# Patient Record
Sex: Female | Born: 1950 | Race: White | Hispanic: No | Smoking: Never smoker
Health system: Southern US, Community
[De-identification: ages and names within clinical notes are randomized; demographics above are authoritative.]

## PROBLEM LIST (undated history)

## (undated) DIAGNOSIS — K219 Gastro-esophageal reflux disease without esophagitis: Secondary | ICD-10-CM

## (undated) DIAGNOSIS — R928 Other abnormal and inconclusive findings on diagnostic imaging of breast: Secondary | ICD-10-CM

## (undated) DIAGNOSIS — I10 Essential (primary) hypertension: Secondary | ICD-10-CM

## (undated) DIAGNOSIS — R42 Dizziness and giddiness: Secondary | ICD-10-CM

## (undated) DIAGNOSIS — R011 Cardiac murmur, unspecified: Secondary | ICD-10-CM

## (undated) HISTORY — PX: COLONOSCOPY: SHX174

## (undated) HISTORY — DX: Other abnormal and inconclusive findings on diagnostic imaging of breast: R92.8

## (undated) HISTORY — DX: Essential (primary) hypertension: I10

## (undated) HISTORY — DX: Gastro-esophageal reflux disease without esophagitis: K21.9

## (undated) HISTORY — DX: Cardiac murmur, unspecified: R01.1

## (undated) HISTORY — DX: Dizziness and giddiness: R42

---

## 1988-01-19 DIAGNOSIS — K219 Gastro-esophageal reflux disease without esophagitis: Secondary | ICD-10-CM

## 1988-01-19 HISTORY — DX: Gastro-esophageal reflux disease without esophagitis: K21.9

## 1999-01-19 DIAGNOSIS — I10 Essential (primary) hypertension: Secondary | ICD-10-CM

## 1999-01-19 HISTORY — DX: Essential (primary) hypertension: I10

## 2006-07-06 ENCOUNTER — Ambulatory Visit: Payer: Self-pay | Admitting: Family Medicine

## 2006-09-21 ENCOUNTER — Ambulatory Visit: Payer: Self-pay | Admitting: Gastroenterology

## 2006-12-09 ENCOUNTER — Ambulatory Visit: Payer: Self-pay | Admitting: Gastroenterology

## 2007-09-19 ENCOUNTER — Ambulatory Visit: Payer: Self-pay | Admitting: Family Medicine

## 2007-09-28 ENCOUNTER — Ambulatory Visit: Payer: Self-pay | Admitting: Family Medicine

## 2008-03-27 ENCOUNTER — Ambulatory Visit: Payer: Self-pay | Admitting: Family Medicine

## 2008-10-01 ENCOUNTER — Ambulatory Visit: Payer: Self-pay | Admitting: Family Medicine

## 2009-04-14 ENCOUNTER — Emergency Department (HOSPITAL_COMMUNITY): Admission: EM | Admit: 2009-04-14 | Discharge: 2009-04-14 | Payer: Self-pay | Admitting: Emergency Medicine

## 2009-10-21 ENCOUNTER — Ambulatory Visit: Payer: Self-pay | Admitting: Family Medicine

## 2010-03-12 ENCOUNTER — Ambulatory Visit: Payer: Self-pay | Admitting: Orthopedic Surgery

## 2010-12-01 ENCOUNTER — Ambulatory Visit: Payer: Self-pay | Admitting: Family Medicine

## 2011-01-19 DIAGNOSIS — R42 Dizziness and giddiness: Secondary | ICD-10-CM

## 2011-01-19 HISTORY — PX: MM BREAST STEREO BX*R*R/S: HXRAD496

## 2011-01-19 HISTORY — DX: Dizziness and giddiness: R42

## 2011-12-02 ENCOUNTER — Ambulatory Visit: Payer: Self-pay | Admitting: Family Medicine

## 2011-12-14 ENCOUNTER — Ambulatory Visit: Payer: Self-pay | Admitting: Family Medicine

## 2012-01-10 ENCOUNTER — Ambulatory Visit: Payer: Self-pay | Admitting: General Surgery

## 2012-01-10 HISTORY — PX: BREAST BIOPSY: SHX20

## 2012-01-11 LAB — PATHOLOGY REPORT

## 2012-02-08 ENCOUNTER — Ambulatory Visit: Payer: Self-pay | Admitting: Neurology

## 2012-02-28 ENCOUNTER — Ambulatory Visit: Payer: Self-pay | Admitting: Neurology

## 2012-07-11 ENCOUNTER — Ambulatory Visit: Payer: Self-pay | Admitting: General Surgery

## 2012-07-12 ENCOUNTER — Encounter: Payer: Self-pay | Admitting: General Surgery

## 2012-07-19 ENCOUNTER — Ambulatory Visit: Payer: Self-pay | Admitting: General Surgery

## 2012-07-25 ENCOUNTER — Encounter: Payer: Self-pay | Admitting: General Surgery

## 2012-07-25 ENCOUNTER — Ambulatory Visit (INDEPENDENT_AMBULATORY_CARE_PROVIDER_SITE_OTHER): Payer: BC Managed Care – PPO | Admitting: General Surgery

## 2012-07-25 VITALS — BP 162/82 | HR 76 | Resp 14 | Ht 66.0 in | Wt 206.0 lb

## 2012-07-25 DIAGNOSIS — N63 Unspecified lump in unspecified breast: Secondary | ICD-10-CM

## 2012-07-25 DIAGNOSIS — R928 Other abnormal and inconclusive findings on diagnostic imaging of breast: Secondary | ICD-10-CM

## 2012-07-25 NOTE — Progress Notes (Signed)
Patient ID: Molly Schwartz, female   DOB: March 01, 1950, 62 y.o.   MRN: [id number]  No chief complaint on file.   HPI Molly Schwartz is a 62 y.o. female here today following up from her mammogram done on 07/11/12 cat 1 . Patient dose perform self breast checks and gets regular mammograms. The patient had undergone a stereotactic biopsy on 01/10/2012 for a right breast mass. This is her six-month followup exam. HPI  Past Medical History  Diagnosis Date  . Hypertension 2001  . Heart murmur   . Abnormal mammogram, unspecified   . Vertigo 2013  . GERD (gastroesophageal reflux disease) 1990    No past surgical history on file.  Family History  Problem Relation Age of Onset  . Tongue cancer Sister 89  . Liver cancer Paternal Uncle   . Liver cancer Maternal Uncle     Social History History  Substance Use Topics  . Smoking status: Never Smoker   . Smokeless tobacco: Never Used  . Alcohol Use: No    Allergies no known allergies  Current Outpatient Prescriptions  Medication Sig Dispense Refill  . amLODipine (NORVASC) 5 MG tablet Take 5 mg by mouth daily.      Marland Kitchen aspirin 81 MG tablet Take 81 mg by mouth daily.      . Iron-Vit C-Vit B12-Folic Acid (IRON 100 PLUS PO) Take by mouth.      . lansoprazole (PREVACID) 30 MG capsule Take 30 mg by mouth daily.       No current facility-administered medications for this visit.    Review of Systems Review of Systems  Constitutional: Negative.   Respiratory: Negative.   Cardiovascular: Negative.     There were no vitals taken for this visit.  Physical Exam Physical Exam  Nursing note reviewed. Constitutional: She is oriented to person, place, and time. She appears well-developed and well-nourished.  Cardiovascular: Normal rate, regular rhythm and normal heart sounds.   Pulmonary/Chest: Breath sounds normal. Right breast exhibits no inverted nipple, no mass, no nipple discharge, no skin change and no tenderness. Left breast exhibits no  inverted nipple, no mass, no nipple discharge, no skin change and no tenderness.  Lymphadenopathy:    She has no cervical adenopathy.    She has no axillary adenopathy.  Neurological: She is alert and oriented to person, place, and time.  Skin: Skin is warm and dry.    Data Reviewed Pathology of 01/10/2012 biopsy showed a simple implant.  07/11/2012 mammogram showed a biopsy clip at the site of the previous deep density in the right breast. BI-RAD-1.    Assessment    Benign breast exam.    Plan    The patient should resume annual screening mammograms in December 2014 under the care of her primary care physician.       Ples Specter 07/25/2012, 9:17 AM

## 2012-07-25 NOTE — Patient Instructions (Addendum)
Patient to return as needed. 

## 2013-01-23 ENCOUNTER — Telehealth: Payer: Self-pay | Admitting: *Deleted

## 2013-01-23 MED ORDER — MELOXICAM 15 MG PO TABS: 15.0000 mg | ORAL_TABLET | Freq: Every day | ORAL | Status: DC

## 2013-01-23 NOTE — Telephone Encounter (Signed)
[facility] SENT FAX REQUEST FOR MOBIC 15 MG #30 WITH 2 REFILLS. APPROVED AND SENT OVER TO PHARMACY.

## 2013-06-04 ENCOUNTER — Other Ambulatory Visit: Payer: Self-pay | Admitting: Podiatry

## 2013-09-23 ENCOUNTER — Other Ambulatory Visit: Payer: Self-pay | Admitting: Podiatry

## 2013-11-19 ENCOUNTER — Encounter: Payer: Self-pay | Admitting: General Surgery

## 2013-11-26 DIAGNOSIS — I10 Essential (primary) hypertension: Secondary | ICD-10-CM | POA: Insufficient documentation

## 2014-01-16 ENCOUNTER — Ambulatory Visit: Payer: Self-pay | Admitting: Family Medicine

## 2014-03-04 ENCOUNTER — Other Ambulatory Visit: Payer: Self-pay | Admitting: Podiatry

## 2014-12-31 ENCOUNTER — Other Ambulatory Visit: Payer: Self-pay | Admitting: Family Medicine

## 2014-12-31 DIAGNOSIS — Z1231 Encounter for screening mammogram for malignant neoplasm of breast: Secondary | ICD-10-CM

## 2015-01-17 ENCOUNTER — Ambulatory Visit: Payer: Self-pay

## 2015-01-29 ENCOUNTER — Ambulatory Visit
Admission: RE | Admit: 2015-01-29 | Discharge: 2015-01-29 | Disposition: A | Discharge status: Home / Self Care | Payer: BLUE CROSS/BLUE SHIELD | Source: Ambulatory Visit | Attending: Family Medicine | Admitting: Family Medicine

## 2015-01-29 DIAGNOSIS — Z1231 Encounter for screening mammogram for malignant neoplasm of breast: Secondary | ICD-10-CM

## 2016-01-07 ENCOUNTER — Other Ambulatory Visit: Payer: Self-pay | Admitting: Adult Health

## 2016-01-07 DIAGNOSIS — M7989 Other specified soft tissue disorders: Secondary | ICD-10-CM

## 2016-01-13 ENCOUNTER — Ambulatory Visit
Admission: RE | Admit: 2016-01-13 | Discharge: 2016-01-13 | Disposition: A | Discharge status: Home / Self Care | Payer: BLUE CROSS/BLUE SHIELD | Source: Ambulatory Visit | Attending: Adult Health | Admitting: Adult Health

## 2016-01-13 DIAGNOSIS — M7989 Other specified soft tissue disorders: Secondary | ICD-10-CM

## 2016-01-20 ENCOUNTER — Other Ambulatory Visit: Payer: Self-pay | Admitting: Family Medicine

## 2016-01-23 ENCOUNTER — Other Ambulatory Visit: Payer: Self-pay | Admitting: Family Medicine

## 2016-01-23 DIAGNOSIS — Z1231 Encounter for screening mammogram for malignant neoplasm of breast: Secondary | ICD-10-CM

## 2016-02-03 ENCOUNTER — Ambulatory Visit (INDEPENDENT_AMBULATORY_CARE_PROVIDER_SITE_OTHER): Payer: BLUE CROSS/BLUE SHIELD | Admitting: Vascular Surgery

## 2016-02-03 ENCOUNTER — Encounter (INDEPENDENT_AMBULATORY_CARE_PROVIDER_SITE_OTHER): Payer: Self-pay | Admitting: Vascular Surgery

## 2016-02-03 VITALS — BP 142/87 | HR 64 | Resp 14 | Ht 67.0 in | Wt 200.0 lb

## 2016-02-03 DIAGNOSIS — M79609 Pain in unspecified limb: Secondary | ICD-10-CM | POA: Insufficient documentation

## 2016-02-03 DIAGNOSIS — M79605 Pain in left leg: Secondary | ICD-10-CM

## 2016-02-03 DIAGNOSIS — M7989 Other specified soft tissue disorders: Secondary | ICD-10-CM | POA: Diagnosis not present

## 2016-02-03 DIAGNOSIS — I10 Essential (primary) hypertension: Secondary | ICD-10-CM

## 2016-02-03 DIAGNOSIS — M79604 Pain in right leg: Secondary | ICD-10-CM | POA: Diagnosis not present

## 2016-02-03 NOTE — Assessment & Plan Note (Signed)
The patient has numbness and tingling in her legs that is not entirely clear in its etiology. Venous insufficiency could be playing a contributing  rollalthough some of her symptoms sound neuropathic. Evaluation will continue with venous duplex

## 2016-02-03 NOTE — Assessment & Plan Note (Signed)
Cause is not entirely clear. Compression stockings and elevation recommended.

## 2016-02-03 NOTE — Patient Instructions (Signed)
Venous Stasis or Chronic Venous Insufficiency Chronic venous insufficiency, also called venous stasis, is a condition that affects the veins in the legs. The condition prevents blood from being pumped through these veins effectively. Blood may no longer be pumped effectively from the legs back to the heart. This condition can range from mild to severe. With proper treatment, you should be able to continue with an active life. CAUSES  Chronic venous insufficiency occurs when the vein walls become stretched, weakened, or damaged or when valves within the vein are damaged. Some common causes of this include:  High blood pressure inside the veins (venous hypertension).  Increased blood pressure in the leg veins from long periods of sitting or standing.  A blood clot that blocks blood flow in a vein (deep vein thrombosis).  Inflammation of a superficial vein (phlebitis) that causes a blood clot to form. RISK FACTORS Various things can make you more likely to develop chronic venous insufficiency, including:  Family history of this condition.  Obesity.  Pregnancy.  Sedentary lifestyle.  Smoking.  Jobs requiring long periods of standing or sitting in one place.  Being a certain age. Women in their 40s and 50s and men in their 70s are more likely to develop this condition. SIGNS AND SYMPTOMS  Symptoms may include:   Varicose veins.  Skin breakdown or ulcers.  Reddened or discolored skin on the leg.  Brown, smooth, tight, and painful skin just above the ankle, usually on the inside surface (lipodermatosclerosis).  Swelling. DIAGNOSIS  To diagnose this condition, your health care provider will take a medical history and do a physical exam. The following tests may be ordered to confirm the diagnosis:  Duplex ultrasound-A procedure that produces a picture of a blood vessel and nearby organs and also provides information on blood flow through the blood vessel.  Plethysmography-A  procedure that tests blood flow.  A venogram, or venography-A procedure used to look at the veins using X-ray and dye. TREATMENT The goals of treatment are to help you return to an active life and to minimize pain or disability. Treatment will depend on the severity of the condition. Medical procedures may be needed for severe cases. Treatment options may include:   Use of compression stockings. These can help with symptoms and lower the chances of the problem getting worse, but they do not cure the problem.  Sclerotherapy-A procedure involving an injection of a material that "dissolves" the damaged veins. Other veins in the network of blood vessels take over the function of the damaged veins.  Surgery to remove the vein or cut off blood flow through the vein (vein stripping or laser ablation surgery).  Surgery to repair a valve. HOME CARE INSTRUCTIONS   Wear compression stockings as directed by your health care provider.  Only take over-the-counter or prescription medicines for pain, discomfort, or fever as directed by your health care provider.  Follow up with your health care provider as directed. SEEK MEDICAL CARE IF:   You have redness, swelling, or increasing pain in the affected area.  You see a red streak or line that extends up or down from the affected area.  You have a breakdown or loss of skin in the affected area, even if the breakdown is small.  You have an injury to the affected area. SEEK IMMEDIATE MEDICAL CARE IF:   You have an injury and open wound in the affected area.  Your pain is severe and does not improve with medicine.  You have   sudden numbness or weakness in the foot or ankle below the affected area, or you have trouble moving your foot or ankle.  You have a fever or persistent symptoms for more than 2-3 days.  You have a fever and your symptoms suddenly get worse. MAKE SURE YOU:   Understand these instructions.  Will watch your condition.  Will  get help right away if you are not doing well or get worse. This information is not intended to replace advice given to you by your health care provider. Make sure you discuss any questions you have with your health care provider. Document Released: 05/10/2006 Document Revised: 10/25/2012 Document Reviewed: 09/11/2012 Elsevier Interactive Patient Education  2017 [employer]  

## 2016-02-03 NOTE — Progress Notes (Addendum)
Patient ID: Molly Schwartz, female   DOB: 04-02-1950, 66 y.o.   MRN: [id number]  Chief Complaint  Patient presents with  . New Patient (Initial Visit)    HPI HASSET Schwartz is a 66 y.o. female.  I am asked to see the patient by Dr. Burnett Sheng for evaluation of leg swelling.  About 2 months ago, the patient developed pain and swelling in her left leg associated with redness. She was given an antibiotic and treated for cellulitis. This helped, but the swelling persists particularly in the left leg. The right leg has only mild swelling. There was no clear inciting event or causative factor that started the symptoms.  The left leg is more severly affected. The patient elevates the legs for relief. The pain is described as stinging and tingling. This is associated with numbness and burning in her legs bilaterally. 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 The symptoms are generally most severe in the evening, particularly when they have been on their feet for long periods of time. Anti-inflammatories and elevation has been used to try to improve the symptoms with limited success. The patient has no previous history of deep venous thrombosis or superficial thrombophlebitis to their knowledge.     Past Medical History:  Diagnosis Date  . Abnormal mammogram, unspecified   . GERD (gastroesophageal reflux disease) 1990  . Heart murmur   . Hypertension 2001  . Vertigo 2013    Past Surgical History:  Procedure Laterality Date  . BREAST BIOPSY Right 01/10/12   bx/clip-neg  . MM BREAST STEREO BX*R*R/S Right 2013    Family History  Problem Relation Age of Onset  . Tongue cancer Sister 71  . Liver cancer Paternal Uncle   . Liver cancer Maternal Uncle   . Breast cancer Cousin     mat cousin  . COPD Mother   . Hyperlipidemia Mother   . Hyperlipidemia Father   . Stroke Father     Social History Social History  Substance Use Topics  . Smoking  status: Never Smoker  . Smokeless tobacco: Never Used  . Alcohol use No  Works in a mill on concrete floors. No IV drug use  No Known Allergies  Current Outpatient Prescriptions  Medication Sig Dispense Refill  . amLODipine (NORVASC) 5 MG tablet Take 5 mg by mouth daily.    Marland Kitchen aspirin 81 MG tablet Take 81 mg by mouth daily.    . lansoprazole (PREVACID) 30 MG capsule Take 30 mg by mouth daily.    Marland Kitchen lisinopril (PRINIVIL,ZESTRIL) 20 MG tablet TAKE 1 TABLET (20 MG TOTAL) BY MOUTH ONCE DAILY.  11  . Iron-Vit C-Vit B12-Folic Acid (IRON 100 PLUS PO) Take by mouth.    . meloxicam (MOBIC) 15 MG tablet TAKE 1 TABLET (15 MG TOTAL) BY MOUTH DAILY. (Patient not taking: Reported on 02/03/2016) 30 tablet 3   No current facility-administered medications for this visit.       REVIEW OF SYSTEMS (Negative unless checked)  Constitutional: [] Weight loss  [] Fever  [] Chills Cardiac: [] Chest pain   [] Chest pressure   [] Palpitations   [] Shortness of breath when laying flat   [] Shortness of breath at rest   [] Shortness of breath with exertion. Vascular:  [] Pain in legs with walking   [] Pain in legs at rest   [] Pain in legs when laying flat   [] Claudication   [] Pain in feet when walking  [] Pain  in feet at rest  [] Pain in feet when laying flat   [] History of DVT   [] Phlebitis   [x] Swelling in legs   [x] Varicose veins   [] Non-healing ulcers Pulmonary:   [] Uses home oxygen   [] Productive cough   [] Hemoptysis   [] Wheeze  [] COPD   [] Asthma Neurologic:  [] Dizziness  [] Blackouts   [] Seizures   [] History of stroke   [] History of TIA  [] Aphasia   [] Temporary blindness   [] Dysphagia   [] Weakness or numbness in arms   [x] Weakness or numbness in legs Musculoskeletal:  [] Arthritis   [] Joint swelling   [] Joint pain   [] Low back pain Hematologic:  [] Easy bruising  [] Easy bleeding   [] Hypercoagulable state   [] Anemic  [] Hepatitis Gastrointestinal:  [] Blood in stool   [] Vomiting blood  [] Gastroesophageal reflux/heartburn    [] Abdominal pain Genitourinary:  [] Chronic kidney disease   [] Difficult urination  [] Frequent urination  [] Burning with urination   [] Hematuria Skin:  [] Rashes   [] Ulcers   [] Wounds Psychological:  [] History of anxiety   []  History of major depression.    Physical Exam BP (!) 142/87   Pulse 64   Resp 14   Ht 5\' 7"  (1.702 m)   Wt 200 lb (90.7 kg)   BMI 31.32 kg/m  Gen:  WD/WN, NAD. Appears younger than stated age.  Head: [place]/AT, No temporalis wasting.  Ear/Nose/Throat: Hearing grossly intact, dentition Good Eyes: Sclera non-icteric. Conjunctiva clear Neck: Supple, no nuchal rigidity. Trachea midline Pulmonary:  Good air movement, no use of accessory muscles, respirations not labored.  Cardiac: RRR, No JVD Vascular: Varicosities scattered and measuring up to 2 mm in the right lower extremity        Varicosities scattered and measuring up to 2 mm in the left lower extremity Vessel Right Left  Radial Palpable Palpable  Ulnar Palpable Palpable  Brachial Palpable Palpable  Carotid Palpable, without bruit Palpable, without bruit  Aorta Not palpable N/A  Femoral Palpable Palpable  Popliteal Palpable Palpable  PT Palpable Palpable  DP Palpable Palpable   Gastrointestinal: soft, non-tender/non-distended. No guarding/reflex. No masses, surgical incisions, or scars. Musculoskeletal: M/S 5/5 throughout.   No  RLE edema.  1 + LLE edema Neurologic: Sensation grossly intact in extremities.  Symmetrical.  Speech is fluent.  Psychiatric: Judgment intact, Mood & affect appropriate for pt's clinical situation. Dermatologic: No rashes or ulcers noted.  No cellulitis or open wounds. Lymph : No Cervical, Axillary, or Inguinal lymphadenopathy.   Radiology [place] Venous Img Lower Unilateral Left  Result Date: 01/13/2016 CLINICAL DATA:  Acute left lower extremity swelling persisting for 4 weeks. EXAM: LEFT LOWER EXTREMITY VENOUS DOPPLER ULTRASOUND TECHNIQUE: Gray-scale sonography with graded  compression, as well as color Doppler and duplex ultrasound were performed to evaluate the lower extremity deep venous systems from the level of the common femoral vein and including the common femoral, femoral, profunda femoral, popliteal and calf veins including the posterior tibial, peroneal and gastrocnemius veins when visible. The superficial great saphenous vein was also interrogated. Spectral Doppler was utilized to evaluate flow at rest and with distal augmentation maneuvers in the common femoral, femoral and popliteal veins. COMPARISON:  None. FINDINGS: Contralateral Common Femoral Vein: Respiratory phasicity is normal and symmetric with the symptomatic side. No evidence of thrombus. Normal compressibility. Common Femoral Vein: No evidence of thrombus. Normal compressibility, respiratory phasicity and response to augmentation. Saphenofemoral Junction: No evidence of thrombus. Normal compressibility and flow on color Doppler imaging. Profunda Femoral Vein: No evidence of thrombus. Normal compressibility  and flow on color Doppler imaging. Femoral Vein: No evidence of thrombus. Normal compressibility, respiratory phasicity and response to augmentation. Popliteal Vein: No evidence of thrombus. Normal compressibility, respiratory phasicity and response to augmentation. Calf Veins: No evidence of thrombus. Normal compressibility and flow on color Doppler imaging. Superficial Great Saphenous Vein: No evidence of thrombus. Normal compressibility and flow on color Doppler imaging. Venous Reflux:  None. Other Findings:  None. IMPRESSION: No evidence of deep venous thrombosis. Electronically Signed   By: Judie Petit.  Shick M.D.   On: 01/13/2016 13:27    Labs No results found for this or any previous visit (from the past 2160 hour(s)).  Assessment/Plan:  Essential hypertension, benign blood pressure control important in reducing the progression of atherosclerotic disease. On appropriate oral medications.   Pain in  limb The patient has numbness and tingling in her legs that is not entirely clear in its etiology. Venous insufficiency could be playing a contributing  rollalthough some of her symptoms sound neuropathic. Evaluation will continue with venous duplex   Swelling of limb Cause is not entirely clear. Compression stockings and elevation recommended.    The patient has symptoms consistent with chronic venous insufficiency. We discussed the natural history and treatment options for venous disease. I recommended the regular use of 20 - 30 mm Hg compression stockings, and prescribed these today. I recommended leg elevation and anti-inflammatories as needed for pain. I have also recommended a complete venous duplex to assess the venous system for reflux or thrombotic issues. This can be done at the patient's convenience. I will see the patient back after the duplex to assess the response to conservative management, and determine further treatment options.     Festus Barren 02/03/2016, 11:23 AM   This note was created with Dragon medical transcription system.  Any errors from dictation are unintentional.

## 2016-02-03 NOTE — Assessment & Plan Note (Signed)
blood pressure control important in reducing the progression of atherosclerotic disease. On appropriate oral medications.  

## 2016-02-27 ENCOUNTER — Ambulatory Visit
Admission: RE | Admit: 2016-02-27 | Discharge: 2016-02-27 | Disposition: A | Discharge status: Home / Self Care | Payer: BLUE CROSS/BLUE SHIELD | Source: Ambulatory Visit | Attending: Family Medicine | Admitting: Family Medicine

## 2016-02-27 DIAGNOSIS — Z1231 Encounter for screening mammogram for malignant neoplasm of breast: Secondary | ICD-10-CM | POA: Insufficient documentation

## 2016-03-15 ENCOUNTER — Ambulatory Visit (INDEPENDENT_AMBULATORY_CARE_PROVIDER_SITE_OTHER): Payer: BLUE CROSS/BLUE SHIELD | Admitting: Vascular Surgery

## 2016-03-15 ENCOUNTER — Ambulatory Visit (INDEPENDENT_AMBULATORY_CARE_PROVIDER_SITE_OTHER): Payer: BLUE CROSS/BLUE SHIELD

## 2016-03-15 DIAGNOSIS — M7989 Other specified soft tissue disorders: Secondary | ICD-10-CM | POA: Diagnosis not present

## 2016-03-18 ENCOUNTER — Encounter (INDEPENDENT_AMBULATORY_CARE_PROVIDER_SITE_OTHER): Payer: Self-pay | Admitting: Vascular Surgery

## 2016-12-24 ENCOUNTER — Ambulatory Visit: Payer: BLUE CROSS/BLUE SHIELD | Admitting: Anesthesiology

## 2016-12-24 ENCOUNTER — Ambulatory Visit
Admission: RE | Admit: 2016-12-24 | Discharge: 2016-12-24 | Disposition: A | Discharge status: Home / Self Care | Payer: BLUE CROSS/BLUE SHIELD | Source: Ambulatory Visit | Attending: Internal Medicine | Admitting: Internal Medicine

## 2016-12-24 ENCOUNTER — Encounter
Admission: RE | Disposition: A | Discharge status: Home / Self Care | Payer: Self-pay | Source: Ambulatory Visit | Attending: Internal Medicine

## 2016-12-24 DIAGNOSIS — K219 Gastro-esophageal reflux disease without esophagitis: Secondary | ICD-10-CM | POA: Insufficient documentation

## 2016-12-24 DIAGNOSIS — I10 Essential (primary) hypertension: Secondary | ICD-10-CM | POA: Insufficient documentation

## 2016-12-24 DIAGNOSIS — Z1211 Encounter for screening for malignant neoplasm of colon: Secondary | ICD-10-CM | POA: Diagnosis present

## 2016-12-24 DIAGNOSIS — Z7982 Long term (current) use of aspirin: Secondary | ICD-10-CM | POA: Diagnosis not present

## 2016-12-24 DIAGNOSIS — Z79899 Other long term (current) drug therapy: Secondary | ICD-10-CM | POA: Insufficient documentation

## 2016-12-24 DIAGNOSIS — K640 First degree hemorrhoids: Secondary | ICD-10-CM | POA: Diagnosis not present

## 2016-12-24 HISTORY — PX: COLONOSCOPY: SHX5424

## 2016-12-24 SURGERY — COLONOSCOPY
Anesthesia: General

## 2016-12-24 MED ORDER — MIDAZOLAM HCL 2 MG/2ML IJ SOLN
INTRAMUSCULAR | Status: AC
  Filled 2016-12-24: qty 2

## 2016-12-24 MED ORDER — PROPOFOL 500 MG/50ML IV EMUL
INTRAVENOUS | Status: DC | PRN
  Administered 2016-12-24: 150 ug/kg/min via INTRAVENOUS

## 2016-12-24 MED ORDER — PROPOFOL 10 MG/ML IV BOLUS
INTRAVENOUS | Status: DC | PRN
  Administered 2016-12-24: 60 mg via INTRAVENOUS

## 2016-12-24 MED ORDER — SODIUM CHLORIDE 0.9 % IV SOLN
INTRAVENOUS | Status: DC
  Administered 2016-12-24 (×2): via INTRAVENOUS

## 2016-12-24 MED ORDER — PROPOFOL 500 MG/50ML IV EMUL
INTRAVENOUS | Status: AC
  Filled 2016-12-24: qty 50

## 2016-12-24 NOTE — Anesthesia Procedure Notes (Signed)
Date/Time: 12/24/2016 3:49 PM Performed by: Junious SilkNoles, Holly Iannaccone, CRNA Pre-anesthesia Checklist: Patient identified, Emergency Drugs available, Suction available, Patient being monitored and Timeout performed Oxygen Delivery Method: Nasal cannula

## 2016-12-24 NOTE — H&P (Signed)
Outpatient short stay form Pre-procedure 12/24/2016 3:40 PM Molly Schwartz Molly Schwartz, M.D.  Primary Physician:James Burnett ShengHedrick, M.D.  Reason for visit:  Colon cancer screening.  History of present illness: Patient presents for colon cancer screening. The patient denies abdominal pain, abnormal weight loss or rectal bleeding.     Current Facility-Administered Medications:  .  0.9 %  sodium chloride infusion, , Intravenous, Continuous, [place]osed[place], Boykin Nearingeodoro K, MD, Last Rate: 20 mL/hr at 12/24/16 1448  Medications Prior to Admission  Medication Sig Dispense Refill Last Dose  . amLODipine (NORVASC) 5 MG tablet Take 5 mg by mouth daily.   12/23/2016 at Unknown time  . aspirin 81 MG tablet Take 81 mg by mouth daily.   12/23/2016 at Unknown time  . lansoprazole (PREVACID) 30 MG capsule Take 30 mg by mouth daily.   12/23/2016 at Unknown time  . lisinopril (PRINIVIL,ZESTRIL) 20 MG tablet TAKE 1 TABLET (20 MG TOTAL) BY MOUTH ONCE DAILY.  11 12/23/2016 at Unknown time  . Iron-Vit C-Vit B12-Folic Acid (IRON 100 PLUS PO) Take by mouth.   Not Taking at Unknown time  . meloxicam (MOBIC) 15 MG tablet TAKE 1 TABLET (15 MG TOTAL) BY MOUTH DAILY. (Patient not taking: Reported on 02/03/2016) 30 tablet 3 Not Taking at Unknown time     No Known Allergies   Past Medical History:  Diagnosis Date  . Abnormal mammogram, unspecified   . GERD (gastroesophageal reflux disease) 1990  . Heart murmur   . Hypertension 2001  . Vertigo 2013    Review of systems:      Physical Exam  General appearance: alert, cooperative and appears stated age Resp: clear to auscultation bilaterally Cardio: regular rate and rhythm, S1, S2 normal, no murmur, click, rub or gallop GI: soft, non-tender; bowel sounds normal; no masses,  no organomegaly Extremities: extremities normal, atraumatic, no cyanosis or edema     Planned procedures: Colonoscopy. The patient understands the nature of the planned procedure, indications, risks,  alternatives and potential complications including but not limited to bleeding, infection, perforation, damage to internal organs and possible oversedation/side effects from anesthesia. The patient agrees and gives consent to proceed.  Please refer to procedure notes for findings, recommendations and patient disposition/instructions.    Forestine Macho Molly Schwartz, M.D. Gastroenterology 12/24/2016  3:40 PM

## 2016-12-24 NOTE — Anesthesia Preprocedure Evaluation (Signed)
Anesthesia Evaluation  Patient identified by MRN, date of birth, ID band Patient awake    Reviewed: Allergy & Precautions, NPO status , Patient's Chart, lab work & pertinent test results, reviewed documented beta blocker date and time   Airway Mallampati: II  TM Distance: >3 FB     Dental  (+) Chipped   Pulmonary           Cardiovascular hypertension, Pt. on medications + Valvular Problems/Murmurs      Neuro/Psych    GI/Hepatic GERD  ,  Endo/Other    Renal/GU      Musculoskeletal   Abdominal   Peds  Hematology   Anesthesia Other Findings   Reproductive/Obstetrics                             Anesthesia Physical Anesthesia Plan  ASA: III  Anesthesia Plan: General   Post-op Pain Management:    Induction: Intravenous  PONV Risk Score and Plan:   Airway Management Planned:   Additional Equipment:   Intra-op Plan:   Post-operative Plan:   Informed Consent: I have reviewed the patients History and Physical, chart, labs and discussed the procedure including the risks, benefits and alternatives for the proposed anesthesia with the patient or authorized representative who has indicated his/her understanding and acceptance.     Plan Discussed with: CRNA  Anesthesia Plan Comments:         Anesthesia Quick Evaluation

## 2016-12-24 NOTE — Transfer of Care (Signed)
Immediate Anesthesia Transfer of Care Note  Patient: Molly Schwartz  Procedure(s) Performed: COLONOSCOPY (N/A )  Patient Location: PACU  Anesthesia Type:General  Level of Consciousness: awake and oriented  Airway & Oxygen Therapy: Patient Spontanous Breathing and Patient connected to nasal cannula oxygen  Post-op Assessment: Report given to RN and Post -op Vital signs reviewed and stable  Post vital signs: Reviewed and stable  Last Vitals:  Vitals:   12/24/16 1443 12/24/16 1558  BP: (!) 146/75 (!) 83/49  Pulse: 73 63  Resp: 17 (!) 24  Temp: (!) 36.1 C (!) 36 C  SpO2: 99% 98%    Last Pain:  Vitals:   12/24/16 1558  TempSrc: Tympanic         Complications: No apparent anesthesia complications

## 2016-12-24 NOTE — Op Note (Signed)
[facility] Patient Name: Molly Schwartz Procedure Date: 12/24/2016 2:40 PM MRN: [id number] Account #: [id number] Date of Birth: 1950/06/30 Admit Type: Outpatient Age: 66 Room: [facility] ENDO ROOM 3 Gender: Female Note Status: Finalized Procedure:            Colonoscopy Indications:          Screening for colorectal malignant neoplasm Providers:            Boykin Nearing. Norma Fredrickson MD, MD Referring MD:         Rhona Leavens. Burnett Sheng, MD (Referring MD) Medicines:            Propofol per Anesthesia Complications:        No immediate complications. Procedure:            Pre-Anesthesia Assessment:                       - The risks and benefits of the procedure and the                        sedation options and risks were discussed with the                        patient. All questions were answered and informed                        consent was obtained.                       - Patient identification and proposed procedure were                        verified prior to the procedure by the physician and                        the nurse. The procedure was verified in the procedure                        room.                       - ASA Grade Assessment: II - A patient with mild                        systemic disease.                       After obtaining informed consent, the colonoscope was                        passed under direct vision. Throughout the procedure,                        the patient's blood pressure, pulse, and oxygen                        saturations were monitored continuously. The                        Colonoscope was introduced through the anus and  advanced to the the cecum, identified by appendiceal                        orifice and ileocecal valve. The colonoscopy was                        performed without difficulty. The patient tolerated the                        procedure well. The quality of the bowel  preparation                        was good. The ileocecal valve, appendiceal orifice, and                        rectum were photographed. Findings:      Non-bleeding internal hemorrhoids were found during retroflexion. The       hemorrhoids were Grade I (internal hemorrhoids that do not prolapse).      The exam was otherwise without abnormality on direct and retroflexion       views. Impression:           - Non-bleeding internal hemorrhoids.                       - The examination was otherwise normal on direct and                        retroflexion views.                       - No specimens collected. Recommendation:       - Patient has a contact number available for                        emergencies. The signs and symptoms of potential                        delayed complications were discussed with the patient.                        Return to normal activities tomorrow. Written discharge                        instructions were provided to the patient.                       - Resume previous diet.                       - Continue present medications.                       - Repeat colonoscopy in 10 years for screening purposes.                       - The findings and recommendations were discussed with                        the patient and their family. Procedure Code(s):    --- Professional ---  [id number], Colorectal cancer screening; colonoscopy on                        individual not meeting criteria for high risk Diagnosis Code(s):    --- Professional ---                       Z12.11, Encounter for screening for malignant neoplasm                        of colon                       K64.0, First degree hemorrhoids CPT copyright 2016 American Medical Association. All rights reserved. The codes documented in this report are preliminary and upon coder review may  be revised to meet current compliance requirements. Stanton Kidneyeodoro K Lacharles Altschuler MD, MD 12/24/2016  3:55:57 PM This report has been signed electronically. Number of Addenda: 0 Note Initiated On: 12/24/2016 2:40 PM Scope Withdrawal Time: 0 hours 7 minutes 56 seconds  Total Procedure Duration: 0 hours 10 minutes 29 seconds       [facility]

## 2016-12-24 NOTE — Anesthesia Postprocedure Evaluation (Signed)
Anesthesia Post Note  Patient: Molly BrowDeborah J Gorder  Procedure(s) Performed: COLONOSCOPY (N/A )  Patient location during evaluation: Endoscopy Anesthesia Type: General Level of consciousness: awake and alert Pain management: pain level controlled Vital Signs Assessment: post-procedure vital signs reviewed and stable Respiratory status: spontaneous breathing and respiratory function stable Cardiovascular status: stable Anesthetic complications: no     Last Vitals:  Vitals:   12/24/16 1608 12/24/16 1618  BP: 114/70 115/68  Pulse: 63 61  Resp: 15 17  Temp:    SpO2: 100% 100%    Last Pain:  Vitals:   12/24/16 1558  TempSrc: Tympanic                 KEPHART,WILLIAM K

## 2016-12-24 NOTE — Anesthesia Post-op Follow-up Note (Signed)
Anesthesia QCDR form completed.        

## 2016-12-27 ENCOUNTER — Encounter: Payer: Self-pay | Admitting: Internal Medicine

## 2017-04-06 ENCOUNTER — Other Ambulatory Visit: Payer: Self-pay | Admitting: Family Medicine

## 2017-04-06 DIAGNOSIS — Z1231 Encounter for screening mammogram for malignant neoplasm of breast: Secondary | ICD-10-CM

## 2017-04-27 ENCOUNTER — Ambulatory Visit
Admission: RE | Admit: 2017-04-27 | Discharge: 2017-04-27 | Disposition: A | Discharge status: Home / Self Care | Payer: Medicare Other | Source: Ambulatory Visit | Attending: Family Medicine | Admitting: Family Medicine

## 2017-04-27 DIAGNOSIS — Z1231 Encounter for screening mammogram for malignant neoplasm of breast: Secondary | ICD-10-CM | POA: Insufficient documentation

## 2017-09-20 ENCOUNTER — Other Ambulatory Visit: Payer: Self-pay | Admitting: Gastroenterology

## 2017-09-20 DIAGNOSIS — R748 Abnormal levels of other serum enzymes: Secondary | ICD-10-CM

## 2017-09-23 ENCOUNTER — Ambulatory Visit
Admission: RE | Admit: 2017-09-23 | Discharge: 2017-09-23 | Disposition: A | Discharge status: Home / Self Care | Payer: Medicare Other | Source: Ambulatory Visit | Attending: Gastroenterology | Admitting: Gastroenterology

## 2017-09-23 DIAGNOSIS — R748 Abnormal levels of other serum enzymes: Secondary | ICD-10-CM | POA: Insufficient documentation

## 2017-09-23 DIAGNOSIS — K760 Fatty (change of) liver, not elsewhere classified: Secondary | ICD-10-CM | POA: Insufficient documentation

## 2018-06-21 ENCOUNTER — Other Ambulatory Visit: Payer: Self-pay | Admitting: Family Medicine

## 2018-06-21 DIAGNOSIS — Z1231 Encounter for screening mammogram for malignant neoplasm of breast: Secondary | ICD-10-CM

## 2018-08-03 ENCOUNTER — Other Ambulatory Visit: Payer: Self-pay

## 2018-08-03 ENCOUNTER — Ambulatory Visit
Admission: RE | Admit: 2018-08-03 | Discharge: 2018-08-03 | Disposition: A | Discharge status: Home / Self Care | Payer: Medicare Other | Source: Ambulatory Visit | Attending: Family Medicine | Admitting: Family Medicine

## 2018-08-03 DIAGNOSIS — Z1231 Encounter for screening mammogram for malignant neoplasm of breast: Secondary | ICD-10-CM | POA: Insufficient documentation

## 2019-06-26 ENCOUNTER — Other Ambulatory Visit: Payer: Self-pay | Admitting: Family Medicine

## 2019-06-26 DIAGNOSIS — Z1231 Encounter for screening mammogram for malignant neoplasm of breast: Secondary | ICD-10-CM

## 2019-08-07 ENCOUNTER — Ambulatory Visit
Admission: RE | Admit: 2019-08-07 | Discharge: 2019-08-07 | Disposition: A | Discharge status: Home / Self Care | Payer: Medicare Other | Source: Ambulatory Visit | Attending: Family Medicine | Admitting: Family Medicine

## 2019-08-07 DIAGNOSIS — Z1231 Encounter for screening mammogram for malignant neoplasm of breast: Secondary | ICD-10-CM | POA: Insufficient documentation

## 2019-08-09 ENCOUNTER — Other Ambulatory Visit: Payer: Self-pay | Admitting: Family Medicine

## 2019-08-09 DIAGNOSIS — N6320 Unspecified lump in the left breast, unspecified quadrant: Secondary | ICD-10-CM

## 2019-08-09 DIAGNOSIS — R928 Other abnormal and inconclusive findings on diagnostic imaging of breast: Secondary | ICD-10-CM

## 2019-08-22 ENCOUNTER — Encounter: Payer: Self-pay | Admitting: Podiatry

## 2019-08-22 ENCOUNTER — Other Ambulatory Visit: Payer: Self-pay

## 2019-08-22 ENCOUNTER — Ambulatory Visit
Admission: RE | Admit: 2019-08-22 | Discharge: 2019-08-22 | Disposition: A | Discharge status: Home / Self Care | Payer: Medicare Other | Source: Ambulatory Visit | Attending: Family Medicine | Admitting: Family Medicine

## 2019-08-22 ENCOUNTER — Ambulatory Visit: Payer: Medicare Other | Admitting: Podiatry

## 2019-08-22 ENCOUNTER — Ambulatory Visit (INDEPENDENT_AMBULATORY_CARE_PROVIDER_SITE_OTHER): Payer: Medicare Other

## 2019-08-22 DIAGNOSIS — N6320 Unspecified lump in the left breast, unspecified quadrant: Secondary | ICD-10-CM | POA: Diagnosis present

## 2019-08-22 DIAGNOSIS — R928 Other abnormal and inconclusive findings on diagnostic imaging of breast: Secondary | ICD-10-CM | POA: Insufficient documentation

## 2019-08-22 DIAGNOSIS — M722 Plantar fascial fibromatosis: Secondary | ICD-10-CM | POA: Diagnosis not present

## 2019-08-22 DIAGNOSIS — L309 Dermatitis, unspecified: Secondary | ICD-10-CM | POA: Insufficient documentation

## 2019-08-22 DIAGNOSIS — L409 Psoriasis, unspecified: Secondary | ICD-10-CM | POA: Insufficient documentation

## 2019-08-22 MED ORDER — MELOXICAM 15 MG PO TABS
15.0000 mg | ORAL_TABLET | Freq: Every day | ORAL | 3 refills | Status: DC
Start: 2019-08-22 — End: 2020-01-01

## 2019-08-22 MED ORDER — METHYLPREDNISOLONE 4 MG PO TBPK
ORAL_TABLET | ORAL | 0 refills | Status: DC
Start: 2019-08-22 — End: 2023-07-11

## 2019-08-22 NOTE — Progress Notes (Signed)
Subjective:  Patient ID: Molly Schwartz, female    DOB: 02-28-1950,  MRN: [id number] HPI Chief Complaint  Patient presents with  . Foot Pain    Plantar heel right - aching x 1 month, AM pain, treated for PF in 2015, tried stretching  . New Patient (Initial Visit)    69 y.o. female presents with the above complaint.   ROS: Denies fever chills nausea vomiting muscle aches pains calf pain back pain chest pain shortness of breath.  Past Medical History:  Diagnosis Date  . Abnormal mammogram, unspecified   . GERD (gastroesophageal reflux disease) 1990  . Heart murmur   . Hypertension 2001  . Vertigo 2013   Past Surgical History:  Procedure Laterality Date  . BREAST BIOPSY Right 01/10/12   bx/clip-neg, Benign Lymph node  . COLONOSCOPY    . COLONOSCOPY N/A 12/24/2016   Procedure: COLONOSCOPY;  Surgeon: Toledo, Boykin Nearing, MD;  Location: [facility] ENDOSCOPY;  Service: Gastroenterology;  Laterality: N/A;  . MM BREAST STEREO BX*R*R/S Right 2013    Current Outpatient Medications:  .  amLODipine (NORVASC) 5 MG tablet, Take 5 mg by mouth daily., Disp: , Rfl:  .  aspirin 81 MG tablet, Take 81 mg by mouth daily., Disp: , Rfl:  .  lansoprazole (PREVACID) 30 MG capsule, Take 30 mg by mouth daily., Disp: , Rfl:  .  lisinopril (PRINIVIL,ZESTRIL) 20 MG tablet, TAKE 1 TABLET (20 MG TOTAL) BY MOUTH ONCE DAILY., Disp: , Rfl: 11 .  meloxicam (MOBIC) 15 MG tablet, Take 1 tablet (15 mg total) by mouth daily., Disp: 30 tablet, Rfl: 3 .  methylPREDNISolone (MEDROL DOSEPAK) 4 MG TBPK tablet, 6 day dose pack - take as directed, Disp: 21 tablet, Rfl: 0  No Known Allergies Review of Systems Objective:  There were no vitals filed for this visit.  General: Well developed, nourished, in no acute distress, alert and oriented x3   Dermatological: Skin is warm, dry and supple bilateral. Nails x 10 are well maintained; remaining integument appears unremarkable at this time. There are no open sores, no  preulcerative lesions, no rash or signs of infection present.  Vascular: Dorsalis Pedis artery and Posterior Tibial artery pedal pulses are 2/4 bilateral with immedate capillary fill time. Pedal hair growth present. No varicosities and no lower extremity edema present bilateral.   Neruologic: Grossly intact via light touch bilateral. Vibratory intact via tuning fork bilateral. Protective threshold with Semmes Wienstein monofilament intact to all pedal sites bilateral. Patellar and Achilles deep tendon reflexes 2+ bilateral. No Babinski or clonus noted bilateral.   Musculoskeletal: No gross boney pedal deformities bilateral. No pain, crepitus, or limitation noted with foot and ankle range of motion bilateral. Muscular strength 5/5 in all groups tested bilateral.  Pain on palpation medial calcaneal tubercle right heel.  No pain on medial lateral compression of the calcaneus.  No pain on palpation of the Achilles.  The majority of her discomfort is in the lateral band of the plantar fascia.  Gait: Unassisted, Nonantalgic.    Radiographs:  Radiographs taken today demonstrate a plantar distally oriented calcaneal heel spur with slight declination of the spur.  No acute findings.  Assessment & Plan:   Assessment: Plantar lateral calcaneal fasciitis right.  Plan: Discussed etiology pathology and surgical therapies at this point went ahead and injected the right heel today laterally with 20 mg Kenalog 5 mg Marcaine point of maximal tenderness.  Posterior plantar fascial brace to be followed by a night splint.  Discussed appropriate  shoe gear stretching exercise ice therapy sugar modifications.  Also wrote a prescription for Medrol Dosepak to be followed by meloxicam.  Follow-up with her in 1 month     Molly Schwartz T. [place], [place]

## 2019-08-22 NOTE — Patient Instructions (Signed)

## 2019-08-24 ENCOUNTER — Other Ambulatory Visit: Payer: Self-pay | Admitting: Family Medicine

## 2019-08-24 DIAGNOSIS — N6002 Solitary cyst of left breast: Secondary | ICD-10-CM

## 2019-08-24 DIAGNOSIS — R928 Other abnormal and inconclusive findings on diagnostic imaging of breast: Secondary | ICD-10-CM

## 2019-09-26 ENCOUNTER — Ambulatory Visit: Payer: Medicare Other | Admitting: Podiatry

## 2019-09-26 ENCOUNTER — Encounter: Payer: Self-pay | Admitting: Podiatry

## 2019-09-26 ENCOUNTER — Other Ambulatory Visit: Payer: Self-pay

## 2019-09-26 DIAGNOSIS — M722 Plantar fascial fibromatosis: Secondary | ICD-10-CM | POA: Diagnosis not present

## 2019-09-26 NOTE — Progress Notes (Signed)
She presents today for follow-up of her plantar fasciitis right and left.  She states that is doing much better right is the primary 1 and it is about 90 to 95% improved.  Objective: Vital signs are stable alert oriented x3 there is no pain on palpation lateral calcaneal tubercle no pain on palpation posterior tibial tendon or the Achilles.  Assessment: Resolving lateral plantar fasciitis.  Plan: Encouraged her to continue the use of the plantar fascial brace the night splint her good shoes etc. I will follow-up with her on an as-needed basis.

## 2019-12-29 ENCOUNTER — Other Ambulatory Visit: Payer: Self-pay | Admitting: Podiatry

## 2019-12-31 NOTE — Telephone Encounter (Signed)
Please advise 

## 2020-01-23 DIAGNOSIS — Z20822 Contact with and (suspected) exposure to covid-19: Secondary | ICD-10-CM | POA: Diagnosis not present

## 2020-02-13 DIAGNOSIS — J0100 Acute maxillary sinusitis, unspecified: Secondary | ICD-10-CM | POA: Diagnosis not present

## 2020-03-27 ENCOUNTER — Other Ambulatory Visit: Payer: Self-pay

## 2020-03-27 ENCOUNTER — Ambulatory Visit
Admission: RE | Admit: 2020-03-27 | Discharge: 2020-03-27 | Disposition: A | Discharge status: Home / Self Care | Payer: Medicare HMO | Source: Ambulatory Visit | Attending: Family Medicine | Admitting: Family Medicine

## 2020-03-27 DIAGNOSIS — R928 Other abnormal and inconclusive findings on diagnostic imaging of breast: Secondary | ICD-10-CM

## 2020-03-27 DIAGNOSIS — N6002 Solitary cyst of left breast: Secondary | ICD-10-CM

## 2020-09-29 ENCOUNTER — Other Ambulatory Visit: Payer: Self-pay | Admitting: Podiatry

## 2021-01-27 ENCOUNTER — Other Ambulatory Visit: Payer: Self-pay | Admitting: Podiatry

## 2021-05-20 ENCOUNTER — Other Ambulatory Visit: Payer: Self-pay | Admitting: Family Medicine

## 2021-05-20 DIAGNOSIS — Z1231 Encounter for screening mammogram for malignant neoplasm of breast: Secondary | ICD-10-CM

## 2021-06-24 ENCOUNTER — Ambulatory Visit
Admission: RE | Admit: 2021-06-24 | Discharge: 2021-06-24 | Disposition: A | Discharge status: Home / Self Care | Payer: Medicare HMO | Source: Ambulatory Visit | Attending: Family Medicine | Admitting: Family Medicine

## 2021-06-24 DIAGNOSIS — Z1231 Encounter for screening mammogram for malignant neoplasm of breast: Secondary | ICD-10-CM | POA: Insufficient documentation

## 2022-03-12 DIAGNOSIS — H6993 Unspecified Eustachian tube disorder, bilateral: Secondary | ICD-10-CM | POA: Diagnosis not present

## 2022-03-17 DIAGNOSIS — R300 Dysuria: Secondary | ICD-10-CM | POA: Diagnosis not present

## 2022-04-08 DIAGNOSIS — M7989 Other specified soft tissue disorders: Secondary | ICD-10-CM | POA: Diagnosis not present

## 2022-04-08 DIAGNOSIS — M1712 Unilateral primary osteoarthritis, left knee: Secondary | ICD-10-CM | POA: Diagnosis not present

## 2022-04-08 DIAGNOSIS — M25562 Pain in left knee: Secondary | ICD-10-CM | POA: Diagnosis not present

## 2022-05-13 DIAGNOSIS — E119 Type 2 diabetes mellitus without complications: Secondary | ICD-10-CM | POA: Diagnosis not present

## 2022-05-17 ENCOUNTER — Other Ambulatory Visit: Payer: Self-pay | Admitting: Family Medicine

## 2022-05-17 DIAGNOSIS — Z1231 Encounter for screening mammogram for malignant neoplasm of breast: Secondary | ICD-10-CM

## 2022-05-20 DIAGNOSIS — I10 Essential (primary) hypertension: Secondary | ICD-10-CM | POA: Diagnosis not present

## 2022-05-20 DIAGNOSIS — E119 Type 2 diabetes mellitus without complications: Secondary | ICD-10-CM | POA: Diagnosis not present

## 2022-05-20 DIAGNOSIS — F321 Major depressive disorder, single episode, moderate: Secondary | ICD-10-CM | POA: Diagnosis not present

## 2022-05-20 DIAGNOSIS — I480 Paroxysmal atrial fibrillation: Secondary | ICD-10-CM | POA: Diagnosis not present

## 2022-06-28 ENCOUNTER — Ambulatory Visit
Admission: RE | Admit: 2022-06-28 | Discharge: 2022-06-28 | Disposition: A | Discharge status: Home / Self Care | Payer: Medicare HMO | Source: Ambulatory Visit | Attending: Family Medicine | Admitting: Family Medicine

## 2022-06-28 DIAGNOSIS — Z1231 Encounter for screening mammogram for malignant neoplasm of breast: Secondary | ICD-10-CM | POA: Insufficient documentation

## 2022-07-06 DIAGNOSIS — I2089 Other forms of angina pectoris: Secondary | ICD-10-CM | POA: Diagnosis not present

## 2022-07-06 DIAGNOSIS — E119 Type 2 diabetes mellitus without complications: Secondary | ICD-10-CM | POA: Diagnosis not present

## 2022-07-06 DIAGNOSIS — G4730 Sleep apnea, unspecified: Secondary | ICD-10-CM | POA: Diagnosis not present

## 2022-07-06 DIAGNOSIS — E669 Obesity, unspecified: Secondary | ICD-10-CM | POA: Diagnosis not present

## 2022-07-06 DIAGNOSIS — E782 Mixed hyperlipidemia: Secondary | ICD-10-CM | POA: Diagnosis not present

## 2022-07-06 DIAGNOSIS — I10 Essential (primary) hypertension: Secondary | ICD-10-CM | POA: Diagnosis not present

## 2022-07-06 DIAGNOSIS — R42 Dizziness and giddiness: Secondary | ICD-10-CM | POA: Diagnosis not present

## 2022-07-06 DIAGNOSIS — F411 Generalized anxiety disorder: Secondary | ICD-10-CM | POA: Diagnosis not present

## 2022-07-06 DIAGNOSIS — I480 Paroxysmal atrial fibrillation: Secondary | ICD-10-CM | POA: Diagnosis not present

## 2022-08-01 DIAGNOSIS — S29009A Unspecified injury of muscle and tendon of unspecified wall of thorax, initial encounter: Secondary | ICD-10-CM | POA: Diagnosis not present

## 2022-08-01 DIAGNOSIS — S299XXA Unspecified injury of thorax, initial encounter: Secondary | ICD-10-CM | POA: Diagnosis not present

## 2022-08-01 DIAGNOSIS — W01198A Fall on same level from slipping, tripping and stumbling with subsequent striking against other object, initial encounter: Secondary | ICD-10-CM | POA: Diagnosis not present

## 2022-08-01 DIAGNOSIS — Z7901 Long term (current) use of anticoagulants: Secondary | ICD-10-CM | POA: Diagnosis not present

## 2022-08-31 ENCOUNTER — Emergency Department
Admission: EM | Admit: 2022-08-31 | Discharge: 2022-08-31 | Disposition: A | Discharge status: Home / Self Care | Payer: Medicare HMO | Attending: Emergency Medicine | Admitting: Emergency Medicine

## 2022-08-31 ENCOUNTER — Other Ambulatory Visit: Payer: Self-pay

## 2022-08-31 ENCOUNTER — Emergency Department: Payer: Medicare HMO

## 2022-08-31 DIAGNOSIS — M25532 Pain in left wrist: Secondary | ICD-10-CM | POA: Insufficient documentation

## 2022-08-31 DIAGNOSIS — M19071 Primary osteoarthritis, right ankle and foot: Secondary | ICD-10-CM | POA: Diagnosis not present

## 2022-08-31 DIAGNOSIS — M25571 Pain in right ankle and joints of right foot: Secondary | ICD-10-CM | POA: Insufficient documentation

## 2022-08-31 DIAGNOSIS — S199XXA Unspecified injury of neck, initial encounter: Secondary | ICD-10-CM | POA: Diagnosis not present

## 2022-08-31 DIAGNOSIS — Y92410 Unspecified street and highway as the place of occurrence of the external cause: Secondary | ICD-10-CM | POA: Diagnosis not present

## 2022-08-31 DIAGNOSIS — S301XXA Contusion of abdominal wall, initial encounter: Secondary | ICD-10-CM | POA: Insufficient documentation

## 2022-08-31 DIAGNOSIS — M7731 Calcaneal spur, right foot: Secondary | ICD-10-CM | POA: Diagnosis not present

## 2022-08-31 DIAGNOSIS — R079 Chest pain, unspecified: Secondary | ICD-10-CM | POA: Diagnosis not present

## 2022-08-31 DIAGNOSIS — R9082 White matter disease, unspecified: Secondary | ICD-10-CM | POA: Diagnosis not present

## 2022-08-31 DIAGNOSIS — M1812 Unilateral primary osteoarthritis of first carpometacarpal joint, left hand: Secondary | ICD-10-CM | POA: Diagnosis not present

## 2022-08-31 DIAGNOSIS — I10 Essential (primary) hypertension: Secondary | ICD-10-CM | POA: Diagnosis not present

## 2022-08-31 DIAGNOSIS — R519 Headache, unspecified: Secondary | ICD-10-CM | POA: Diagnosis not present

## 2022-08-31 DIAGNOSIS — S20212A Contusion of left front wall of thorax, initial encounter: Secondary | ICD-10-CM | POA: Diagnosis not present

## 2022-08-31 DIAGNOSIS — Z7901 Long term (current) use of anticoagulants: Secondary | ICD-10-CM | POA: Insufficient documentation

## 2022-08-31 DIAGNOSIS — S0990XA Unspecified injury of head, initial encounter: Secondary | ICD-10-CM | POA: Diagnosis not present

## 2022-08-31 DIAGNOSIS — S299XXA Unspecified injury of thorax, initial encounter: Secondary | ICD-10-CM | POA: Diagnosis not present

## 2022-08-31 DIAGNOSIS — R162 Hepatomegaly with splenomegaly, not elsewhere classified: Secondary | ICD-10-CM | POA: Diagnosis not present

## 2022-08-31 DIAGNOSIS — I700 Atherosclerosis of aorta: Secondary | ICD-10-CM | POA: Diagnosis not present

## 2022-08-31 DIAGNOSIS — S2020XA Contusion of thorax, unspecified, initial encounter: Secondary | ICD-10-CM | POA: Diagnosis not present

## 2022-08-31 LAB — CBC WITH DIFFERENTIAL/PLATELET
Abs Immature Granulocytes: 0.11 10*3/uL — ABNORMAL HIGH (ref 0.00–0.07)
Basophils Absolute: 0 10*3/uL (ref 0.0–0.1)
Basophils Relative: 0 %
Eosinophils Absolute: 0.1 10*3/uL (ref 0.0–0.5)
Eosinophils Relative: 1 %
HCT: 37.5 % (ref 36.0–46.0)
Hemoglobin: 11.9 g/dL — ABNORMAL LOW (ref 12.0–15.0)
Immature Granulocytes: 1 %
Lymphocytes Relative: 9 %
Lymphs Abs: 1.1 10*3/uL (ref 0.7–4.0)
MCH: 30.7 pg (ref 26.0–34.0)
MCHC: 31.7 g/dL (ref 30.0–36.0)
MCV: 96.6 fL (ref 80.0–100.0)
Monocytes Absolute: 0.7 10*3/uL (ref 0.1–1.0)
Monocytes Relative: 6 %
Neutro Abs: 9.5 10*3/uL — ABNORMAL HIGH (ref 1.7–7.7)
Neutrophils Relative %: 83 %
Platelets: 192 10*3/uL (ref 150–400)
RBC: 3.88 MIL/uL (ref 3.87–5.11)
RDW: 13.6 % (ref 11.5–15.5)
WBC: 11.5 10*3/uL — ABNORMAL HIGH (ref 4.0–10.5)
nRBC: 0 % (ref 0.0–0.2)

## 2022-08-31 LAB — COMPREHENSIVE METABOLIC PANEL
ALT: 33 U/L (ref 0–44)
AST: 41 U/L (ref 15–41)
Albumin: 3.5 g/dL (ref 3.5–5.0)
Alkaline Phosphatase: 91 U/L (ref 38–126)
Anion gap: 8 (ref 5–15)
BUN: 10 mg/dL (ref 8–23)
CO2: 26 mmol/L (ref 22–32)
Calcium: 8.5 mg/dL — ABNORMAL LOW (ref 8.9–10.3)
Chloride: 104 mmol/L (ref 98–111)
Creatinine, Ser: 0.86 mg/dL (ref 0.44–1.00)
GFR, Estimated: >60 mL/min (ref >60)
Glucose, Bld: 131 mg/dL — ABNORMAL HIGH (ref 70–99)
Potassium: 4.3 mmol/L (ref 3.5–5.1)
Sodium: 138 mmol/L (ref 135–145)
Total Bilirubin: 0.6 mg/dL (ref 0.3–1.2)
Total Protein: 7.6 g/dL (ref 6.5–8.1)

## 2022-08-31 LAB — TROPONIN I (HIGH SENSITIVITY)
Troponin I (High Sensitivity): 6 ng/L (ref <18)
Troponin I (High Sensitivity): 5 ng/L (ref <18)

## 2022-08-31 LAB — LIPASE, BLOOD: Lipase: 40 U/L (ref 11–51)

## 2022-08-31 MED ORDER — LIDOCAINE 5 % EX PTCH: 1.0000 | MEDICATED_PATCH | Freq: Two times a day (BID) | CUTANEOUS | 0 refills | Status: AC

## 2022-08-31 MED ORDER — LIDOCAINE 5 % EX PTCH
1.0000 | MEDICATED_PATCH | CUTANEOUS | Status: DC
  Administered 2022-08-31: 1 via TRANSDERMAL
  Filled 2022-08-31: qty 1

## 2022-08-31 MED ORDER — IOHEXOL 300 MG/ML  SOLN
100.0000 mL | Freq: Once | INTRAMUSCULAR | Status: AC | PRN
  Administered 2022-08-31: 100 mL via INTRAVENOUS

## 2022-08-31 NOTE — ED Notes (Signed)
Pt returned from xray

## 2022-08-31 NOTE — ED Notes (Signed)
Pt verbalizes understanding of discharge instructions. Opportunity for questioning and answers were provided. Pt discharged from ED to home with daughter.    
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
## 2022-08-31 NOTE — ED Notes (Signed)
Daughter called to check on patient. Patient given phone to talk to daughter.
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
## 2022-08-31 NOTE — ED Notes (Signed)
Pt placed on 2L [place] due to oxygen saturation dropping as low as 88%. Pt states that it hurts to take deep breaths. Oxygen saturation fluctuating between 88%-93%. PA notified.
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
## 2022-08-31 NOTE — Discharge Instructions (Signed)
Your CT scans revealed chest and abdominal wall contusions, though no damage to your internal organs.  You may use the Lidoderm patches as well as Tylenol 650 mg every 6-8 hours as needed for pain.  Please also remember to use the incentive spirometer every hour to fully aerate your lungs.  Please return for any new, worsening, or change in symptoms or other concerns.  It was a pleasure caring for you today.

## 2022-08-31 NOTE — ED Notes (Signed)
Pt given incentive spirometer and directed on use

## 2022-09-07 DIAGNOSIS — R400 Somnolence: Secondary | ICD-10-CM | POA: Diagnosis not present

## 2022-09-07 DIAGNOSIS — R946 Abnormal results of thyroid function studies: Secondary | ICD-10-CM | POA: Diagnosis not present

## 2022-09-07 DIAGNOSIS — R2689 Other abnormalities of gait and mobility: Secondary | ICD-10-CM | POA: Diagnosis not present

## 2022-09-07 DIAGNOSIS — Z9181 History of falling: Secondary | ICD-10-CM | POA: Diagnosis not present

## 2022-09-08 ENCOUNTER — Telehealth: Payer: Self-pay

## 2022-09-08 NOTE — Telephone Encounter (Signed)
Transition Care Management Unsuccessful Follow-up Telephone Call  Date of discharge and from where:  08/31/2022 [facility]  Attempts:  1st Attempt  Reason for unsuccessful TCM follow-up call:  Left voice message  Molly Schwartz [facility]  [facility] Population Health Community Resource Care Guide   [email or web address]  ?? [phone]   Website: [email or web address]  [email or web address]

## 2022-09-08 NOTE — Telephone Encounter (Signed)
Transition Care Management Unsuccessful Follow-up Telephone Call  Date of discharge and from where:  08/31/2022 [facility]  Attempts:  2nd Attempt  Reason for unsuccessful TCM follow-up call:  Left voice message  Rilda Bulls Sharol [facility]  [facility] Population Health Community Resource Care Guide   ??[email or web address]  ?? [phone]   Website: [email or web address]  [email or web address]

## 2022-10-28 DIAGNOSIS — R946 Abnormal results of thyroid function studies: Secondary | ICD-10-CM | POA: Diagnosis not present

## 2022-11-09 DIAGNOSIS — E569 Vitamin deficiency, unspecified: Secondary | ICD-10-CM | POA: Diagnosis not present

## 2022-11-09 DIAGNOSIS — E11620 Type 2 diabetes mellitus with diabetic dermatitis: Secondary | ICD-10-CM | POA: Diagnosis not present

## 2022-11-09 DIAGNOSIS — G3184 Mild cognitive impairment, so stated: Secondary | ICD-10-CM | POA: Diagnosis not present

## 2022-11-09 DIAGNOSIS — R0683 Snoring: Secondary | ICD-10-CM | POA: Diagnosis not present

## 2022-11-09 DIAGNOSIS — E559 Vitamin D deficiency, unspecified: Secondary | ICD-10-CM | POA: Diagnosis not present

## 2022-11-09 DIAGNOSIS — Z118 Encounter for screening for other infectious and parasitic diseases: Secondary | ICD-10-CM | POA: Diagnosis not present

## 2022-11-09 DIAGNOSIS — R42 Dizziness and giddiness: Secondary | ICD-10-CM | POA: Diagnosis not present

## 2022-11-17 ENCOUNTER — Other Ambulatory Visit: Payer: Self-pay | Admitting: Neurology

## 2022-11-17 DIAGNOSIS — R42 Dizziness and giddiness: Secondary | ICD-10-CM

## 2022-11-18 DIAGNOSIS — I10 Essential (primary) hypertension: Secondary | ICD-10-CM | POA: Diagnosis not present

## 2022-11-18 DIAGNOSIS — E119 Type 2 diabetes mellitus without complications: Secondary | ICD-10-CM | POA: Diagnosis not present

## 2022-11-25 DIAGNOSIS — F321 Major depressive disorder, single episode, moderate: Secondary | ICD-10-CM | POA: Diagnosis not present

## 2022-11-25 DIAGNOSIS — N1831 Chronic kidney disease, stage 3a: Secondary | ICD-10-CM | POA: Diagnosis not present

## 2022-11-25 DIAGNOSIS — Z0000 Encounter for general adult medical examination without abnormal findings: Secondary | ICD-10-CM | POA: Diagnosis not present

## 2022-11-25 DIAGNOSIS — I480 Paroxysmal atrial fibrillation: Secondary | ICD-10-CM | POA: Diagnosis not present

## 2022-11-25 DIAGNOSIS — F411 Generalized anxiety disorder: Secondary | ICD-10-CM | POA: Diagnosis not present

## 2022-11-25 DIAGNOSIS — R748 Abnormal levels of other serum enzymes: Secondary | ICD-10-CM | POA: Diagnosis not present

## 2022-11-25 DIAGNOSIS — E1122 Type 2 diabetes mellitus with diabetic chronic kidney disease: Secondary | ICD-10-CM | POA: Diagnosis not present

## 2022-11-25 DIAGNOSIS — I10 Essential (primary) hypertension: Secondary | ICD-10-CM | POA: Diagnosis not present

## 2022-11-25 DIAGNOSIS — Z1331 Encounter for screening for depression: Secondary | ICD-10-CM | POA: Diagnosis not present

## 2022-12-06 ENCOUNTER — Ambulatory Visit
Admission: RE | Admit: 2022-12-06 | Discharge: 2022-12-06 | Disposition: A | Discharge status: Home / Self Care | Payer: Medicare HMO | Source: Ambulatory Visit | Attending: Neurology | Admitting: Neurology

## 2022-12-06 DIAGNOSIS — R42 Dizziness and giddiness: Secondary | ICD-10-CM

## 2022-12-06 MED ORDER — GADOPICLENOL 0.5 MMOL/ML IV SOLN
10.0000 mL | Freq: Once | INTRAVENOUS | Status: AC | PRN
  Administered 2022-12-06: 10 mL via INTRAVENOUS

## 2023-01-04 DIAGNOSIS — E119 Type 2 diabetes mellitus without complications: Secondary | ICD-10-CM | POA: Diagnosis not present

## 2023-01-04 DIAGNOSIS — E66811 Obesity, class 1: Secondary | ICD-10-CM | POA: Diagnosis not present

## 2023-01-04 DIAGNOSIS — R42 Dizziness and giddiness: Secondary | ICD-10-CM | POA: Diagnosis not present

## 2023-01-04 DIAGNOSIS — I10 Essential (primary) hypertension: Secondary | ICD-10-CM | POA: Diagnosis not present

## 2023-01-04 DIAGNOSIS — G4730 Sleep apnea, unspecified: Secondary | ICD-10-CM | POA: Diagnosis not present

## 2023-01-04 DIAGNOSIS — F411 Generalized anxiety disorder: Secondary | ICD-10-CM | POA: Diagnosis not present

## 2023-01-04 DIAGNOSIS — G4733 Obstructive sleep apnea (adult) (pediatric): Secondary | ICD-10-CM | POA: Diagnosis not present

## 2023-01-04 DIAGNOSIS — I2089 Other forms of angina pectoris: Secondary | ICD-10-CM | POA: Diagnosis not present

## 2023-01-04 DIAGNOSIS — E782 Mixed hyperlipidemia: Secondary | ICD-10-CM | POA: Diagnosis not present

## 2023-01-04 DIAGNOSIS — I480 Paroxysmal atrial fibrillation: Secondary | ICD-10-CM | POA: Diagnosis not present

## 2023-01-07 DIAGNOSIS — R300 Dysuria: Secondary | ICD-10-CM | POA: Diagnosis not present

## 2023-05-30 ENCOUNTER — Other Ambulatory Visit: Payer: Self-pay | Admitting: Family Medicine

## 2023-05-30 DIAGNOSIS — Z1231 Encounter for screening mammogram for malignant neoplasm of breast: Secondary | ICD-10-CM

## 2023-06-29 ENCOUNTER — Ambulatory Visit
Admission: RE | Admit: 2023-06-29 | Discharge: 2023-06-29 | Disposition: A | Discharge status: Home / Self Care | Source: Ambulatory Visit | Attending: Family Medicine | Admitting: Family Medicine

## 2023-06-29 DIAGNOSIS — Z1231 Encounter for screening mammogram for malignant neoplasm of breast: Secondary | ICD-10-CM | POA: Insufficient documentation

## 2023-07-04 ENCOUNTER — Other Ambulatory Visit: Payer: Self-pay | Admitting: Family Medicine

## 2023-07-04 DIAGNOSIS — R928 Other abnormal and inconclusive findings on diagnostic imaging of breast: Secondary | ICD-10-CM

## 2023-07-08 ENCOUNTER — Ambulatory Visit
Admission: RE | Admit: 2023-07-08 | Discharge: 2023-07-08 | Disposition: A | Discharge status: Home / Self Care | Source: Ambulatory Visit | Attending: Family Medicine | Admitting: Family Medicine

## 2023-07-08 DIAGNOSIS — R928 Other abnormal and inconclusive findings on diagnostic imaging of breast: Secondary | ICD-10-CM | POA: Diagnosis present

## 2023-07-13 ENCOUNTER — Ambulatory Visit
Admission: RE | Admit: 2023-07-13 | Discharge: 2023-07-13 | Disposition: A | Discharge status: Home / Self Care | Attending: Internal Medicine | Admitting: Internal Medicine

## 2023-07-13 ENCOUNTER — Encounter
Admission: RE | Disposition: A | Discharge status: Home / Self Care | Payer: Self-pay | Source: Home / Self Care | Attending: Internal Medicine

## 2023-07-13 ENCOUNTER — Encounter: Payer: Self-pay | Admitting: Anesthesiology

## 2023-07-13 ENCOUNTER — Encounter: Payer: Self-pay | Admitting: Internal Medicine

## 2023-07-13 DIAGNOSIS — Z539 Procedure and treatment not carried out, unspecified reason: Secondary | ICD-10-CM | POA: Insufficient documentation

## 2023-07-13 DIAGNOSIS — I4819 Other persistent atrial fibrillation: Secondary | ICD-10-CM

## 2023-07-13 DIAGNOSIS — I4891 Unspecified atrial fibrillation: Secondary | ICD-10-CM | POA: Diagnosis present

## 2023-07-13 HISTORY — PX: CARDIOVERSION: SHX1299

## 2023-07-13 SURGERY — CARDIOVERSION
Anesthesia: General

## 2023-07-13 MED ORDER — PHENYLEPHRINE 80 MCG/ML (10ML) SYRINGE FOR IV PUSH (FOR BLOOD PRESSURE SUPPORT)
PREFILLED_SYRINGE | INTRAVENOUS | Status: AC
  Filled 2023-07-13: qty 10

## 2023-07-13 MED ORDER — SODIUM CHLORIDE 0.9 % IV SOLN: INTRAVENOUS | Status: DC

## 2023-07-13 MED ORDER — LIDOCAINE HCL (PF) 2 % IJ SOLN
INTRAMUSCULAR | Status: AC
  Filled 2023-07-13: qty 5

## 2023-07-13 MED ORDER — PROPOFOL 10 MG/ML IV BOLUS
INTRAVENOUS | Status: AC
  Filled 2023-07-13: qty 20

## 2023-07-13 MED ORDER — GLYCOPYRROLATE 0.2 MG/ML IJ SOLN
INTRAMUSCULAR | Status: AC
  Filled 2023-07-13: qty 1

## 2023-07-13 MED ORDER — EPHEDRINE 5 MG/ML INJ
INTRAVENOUS | Status: AC
  Filled 2023-07-13: qty 5

## 2023-07-13 NOTE — Progress Notes (Signed)
 Pt. EKG show NRS. Secure chatted MD: await orders.

## 2023-07-13 NOTE — Progress Notes (Signed)
 Dr. Florencio confirmed NSR after reviewing EKG. Pt. Cardioversion cancelled for this AM.

## 2023-07-14 ENCOUNTER — Encounter: Payer: Self-pay | Admitting: Internal Medicine

## 2024-04-24 IMAGING — MG MM DIGITAL SCREENING BILAT W/ TOMO AND CAD
8 series · 8 of 24 positions shown · non-contrast
Comparison: Previous exam(s).

CLINICAL DATA: Screening.

EXAM:
DIGITAL SCREENING BILATERAL MAMMOGRAM WITH TOMOSYNTHESIS AND CAD
TECHNIQUE: Bilateral screening digital craniocaudal and mediolateral oblique
mammograms were obtained. Bilateral screening digital breast
tomosynthesis was performed. The images were evaluated with
computer-aided detection.

[R CC synth-2D]
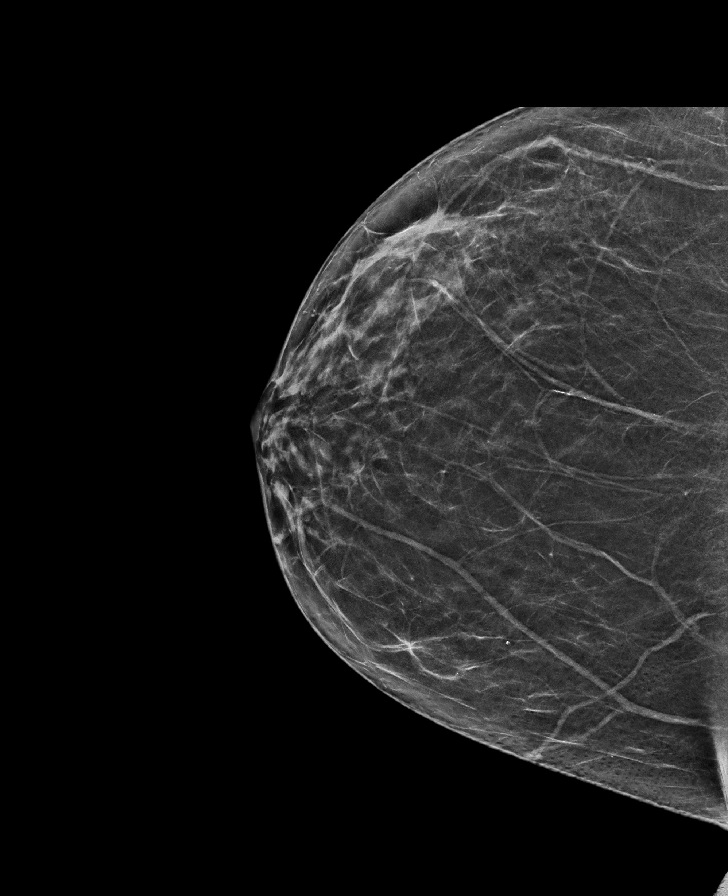

[R MLO synth-2D]
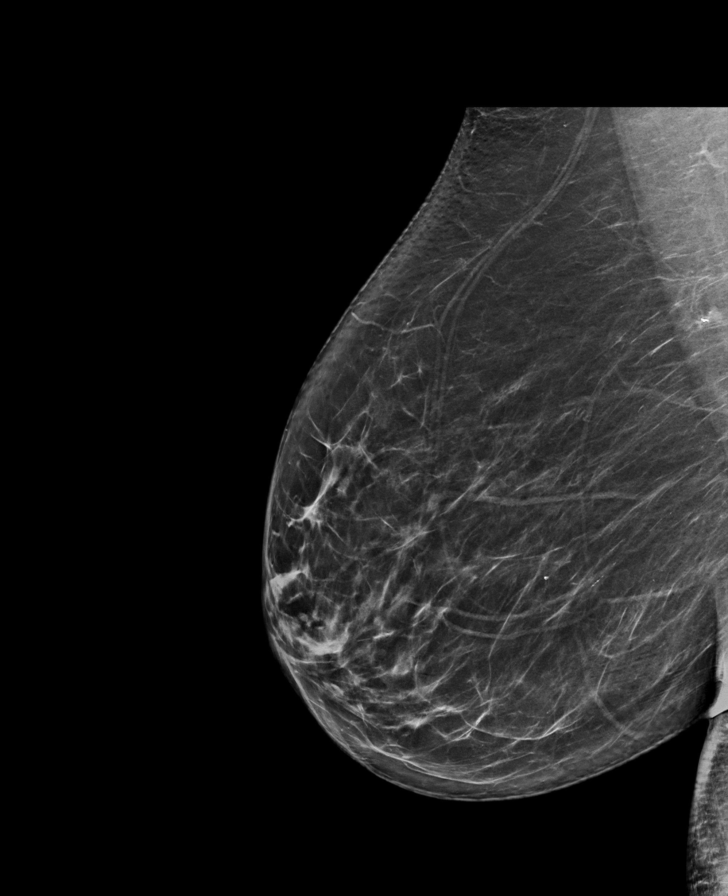

[L CC synth-2D]
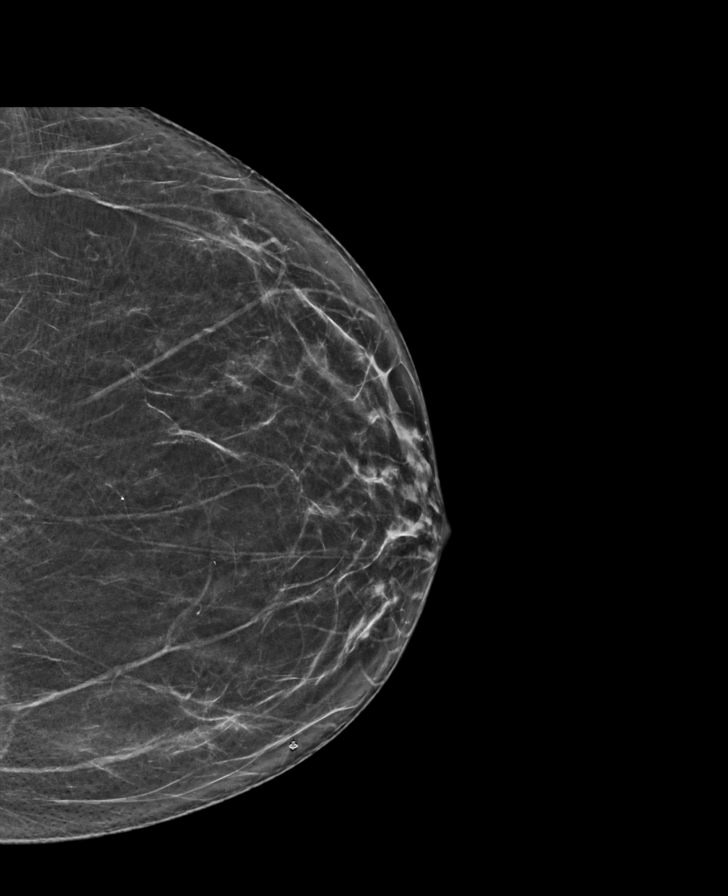

[L MLO synth-2D]
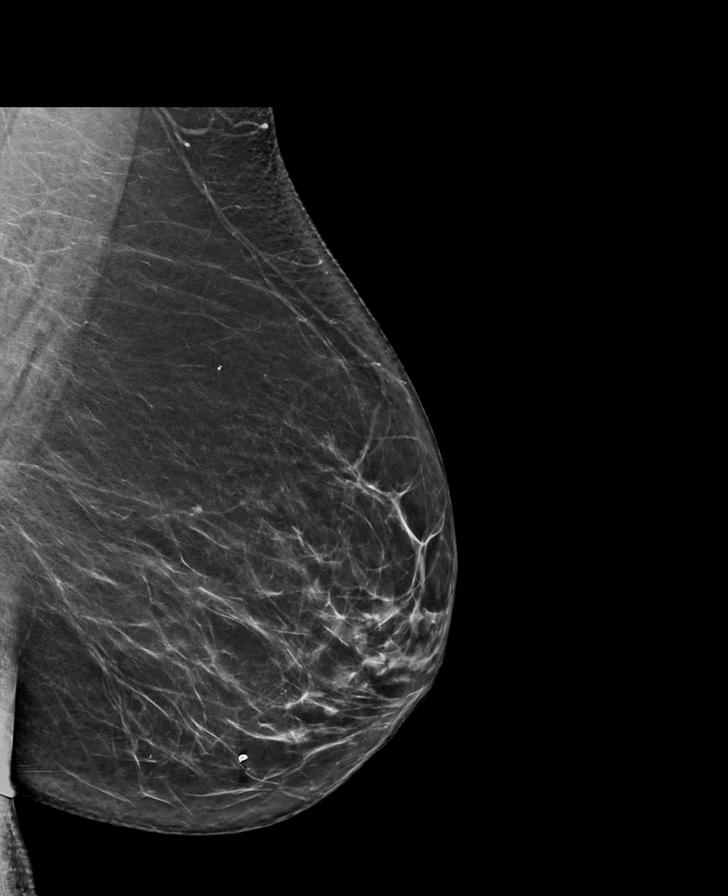

[R MLO tomo · tomo slice 39/76.0]
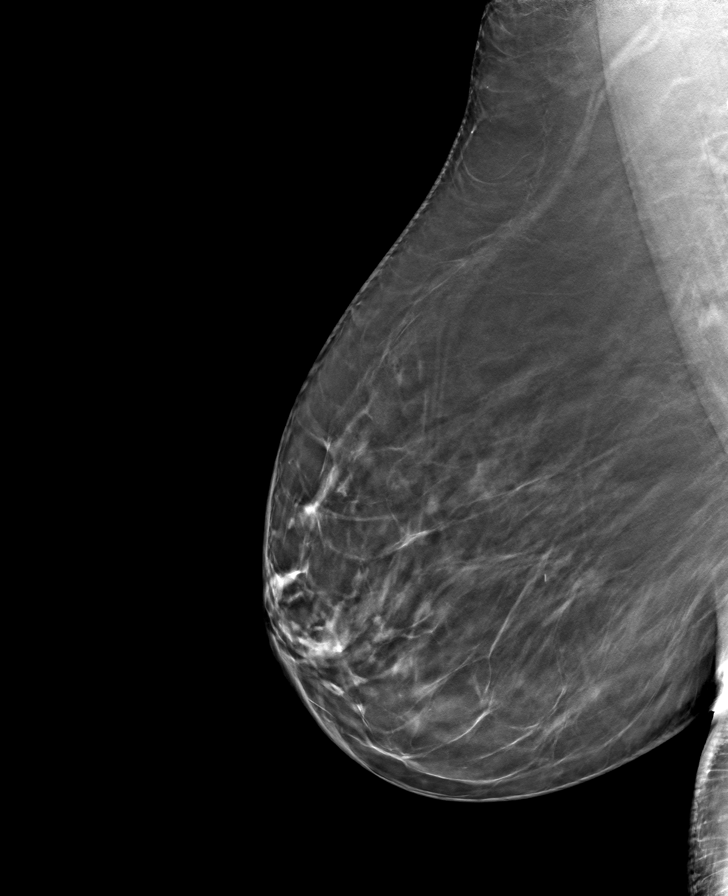

[L CC tomo · tomo slice 33/66.0]
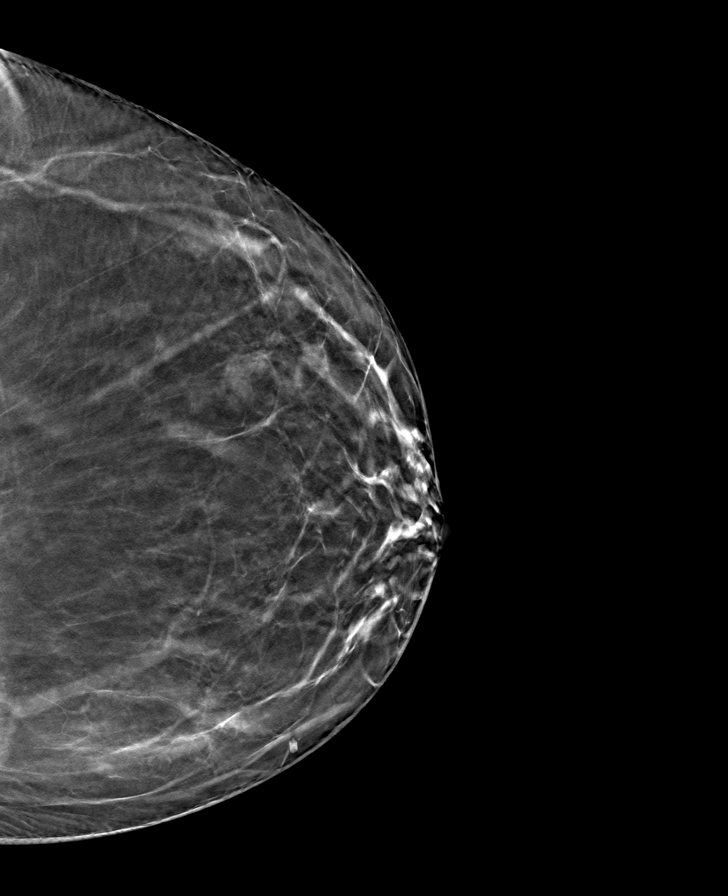

[R CC tomo · tomo slice 33/64.0]
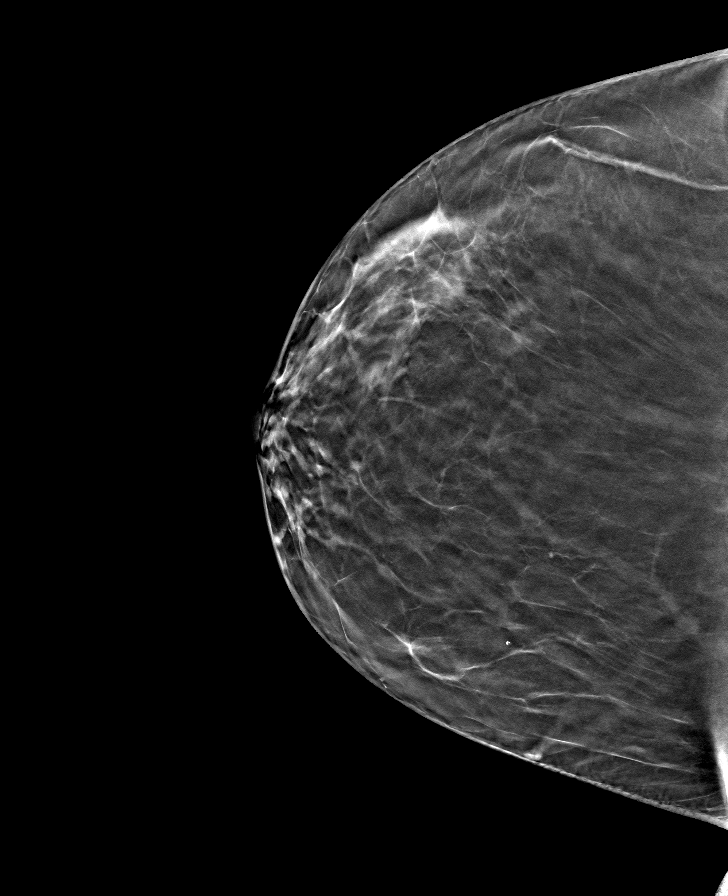

[L MLO tomo · tomo slice 39/78.0]
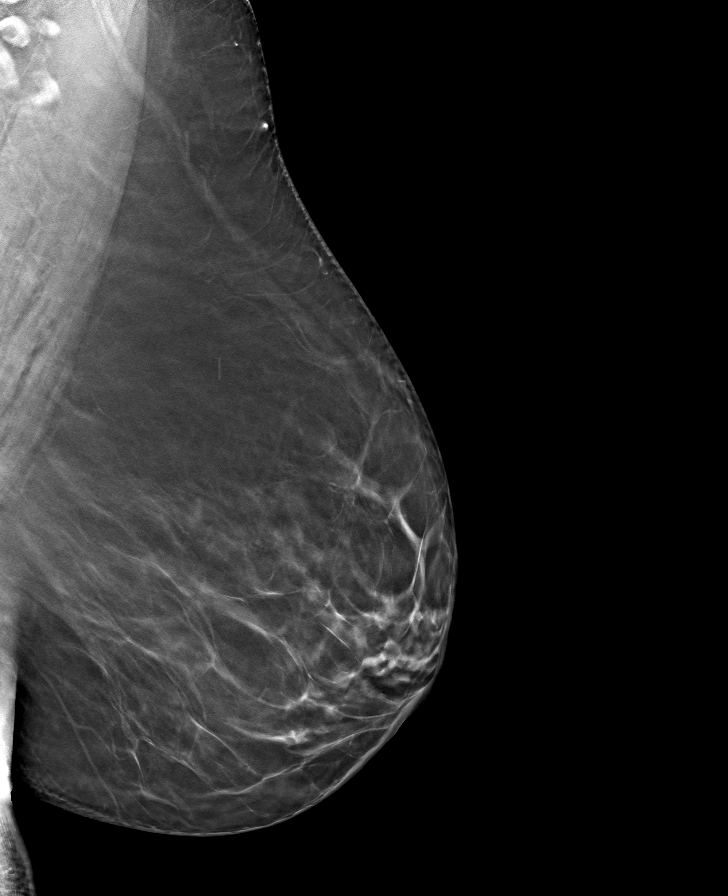

[8 of 24 positions shown; findings below may reference images not displayed]

ACR Breast Density Category b: There are scattered areas of
fibroglandular density.
FINDINGS: There are no findings suspicious for malignancy.
IMPRESSION: No mammographic evidence of malignancy. A result letter of this
screening mammogram will be mailed directly to the patient.

RECOMMENDATION:
Screening mammogram in one year. (Code:51-O-LD2)

BI-RADS CATEGORY  1: Negative.
